# Patient Record
Sex: Female | Born: 1995 | Hispanic: Yes | Marital: Single | Smoking: Never smoker
Health system: Southern US, Community
[De-identification: ages and names within clinical notes are randomized; demographics above are authoritative.]

---
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
## 2017-05-17 ENCOUNTER — Ambulatory Visit (INDEPENDENT_AMBULATORY_CARE_PROVIDER_SITE_OTHER): Payer: No Typology Code available for payment source | Admitting: Sports Medicine

## 2017-05-17 VITALS — BP 110/70 | Ht 65.0 in | Wt 120.0 lb

## 2017-05-17 DIAGNOSIS — M722 Plantar fascial fibromatosis: Secondary | ICD-10-CM

## 2017-05-17 NOTE — Patient Instructions (Signed)
  Wear your custom orthotics in your running shoes  Try the scaphoid pads in your track spikes  Ice your heel for 10 minutes after every run  Do your calf stretches and plantar fascia stretches  You can try a Leafy Kindle[place]assburg Sock if you want. You can get it at [employer]  You're ok to run as long as the pain isn't severe and you're not limping while running

## 2017-05-18 ENCOUNTER — Encounter: Payer: Self-pay | Admitting: Sports Medicine

## 2017-05-18 NOTE — Progress Notes (Addendum)
   Subjective:    Patient ID: Donna Olsen, female    DOB: [birth year], 22 y.o.   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 Pain is most noticeable when walking, especially after running. Her pain does improve somewhat while running. She saw another physician a couple of weeks ago and was diagnosed with plantar fasciitis. She's been treated with iontophoresis in the training room. She was also instructed in some heel drop exercises and has been doing some static calf stretches. She denies radiating pain into the rest of the foot. She denies numbness or tingling. She has not noticed any swelling. No pain at rest. No swelling. She denies similar problems in the past. She is here today with her athletic trainer.  Past medical history reviewed Medications reviewed Allergies reviewed    Review of Systems As above    Objective:   Physical Exam  Well-developed, fit appearing. No acute distress. Awake alert and oriented 3. Vital signs reviewed  Left foot: There is tenderness to palpation at the calcaneal origin of the plantar fascia. No soft tissue swelling. Negative calcaneal squeeze. Negative Tinel's at the tarsal tunnel. Rigid cavus foot. Good pulses. Neurovascularly intact distally.  MSK ultrasound of the left heel was performed. Limited images were obtained. Plantar fascia measures 0.41 cm. No obvious spurring. There is an area of hypoechoic change deep within the plantar fascia likely consistent with inflammation.      Assessment & Plan:   Left heel pain secondary to plantar fasciitis  Patient's plantar fascia is slightly thickened on ultrasound. Clinical exam supports the diagnosis of plantar fasciitis as well. She has a rigid cavus foot that would benefit from custom orthotics.  Custom orthotics were created for her today. We also gave her some scaphoid pads to try in her track spikes. We educated her on proper calf stretching and plantar fascial stretching. She may also try a Strassburg sock at night if she would like. She will ice her heel after running. She may continue with iontophoresis in the training room. She may also continue to run using pain as her guide. Follow-up for ongoing or recalcitrant issues. Total time spent with the patient was 30 minutes with greater than 50% of the time spent in face-to-face consultation discussing diagnosis, treatment, and orthotic construction.  Patient was fitted for a : standard, cushioned, semi-rigid orthotic. The orthotic was heated and afterward the patient stood on the orthotic blank positioned on the orthotic stand. The patient was positioned in subtalar neutral position and 10 degrees of ankle dorsiflexion in a weight bearing stance. After completion of molding, a stable base was applied to the orthotic blank. The blank was ground to a stable position for weight bearing. Size: 8 Base: Blue EVA Posting: none Additional orthotic padding: none

## 2017-08-13 IMAGING — MR MR ANKLE*L* W/O CM
5 series · 40 of 40 positions shown · non-contrast
Comparison: None.

CLINICAL DATA: Lateral left ankle pain with running for more than
45 minutes. No known injury. Initial encounter.

EXAM:
MRI OF THE LEFT ANKLE WITHOUT CONTRAST
TECHNIQUE: Multiplanar, multisequence MR imaging of the ankle was performed. No
intravenous contrast was administered.

[Series 3: T2 fat-sat · axial · 3.0mm · 0.56mm/px · z∈[-44,+69]mm · 9 of 30 slices shown (1 of 3)]
[im 1/30]
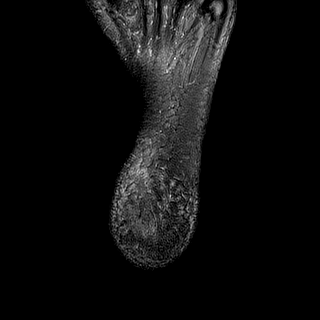
[im 4/30]
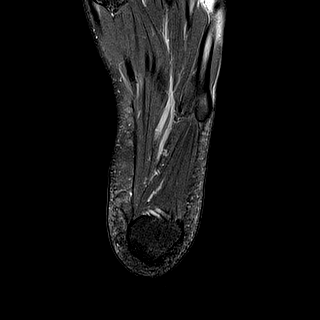
[im 8/30]
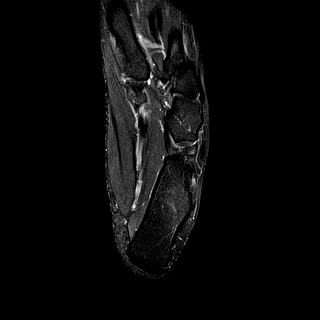
[im 11/30]
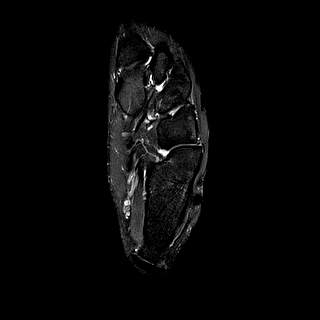
[im 15/30]
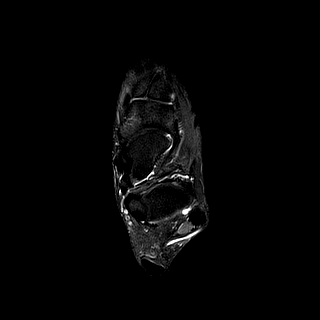
[im 19/30]
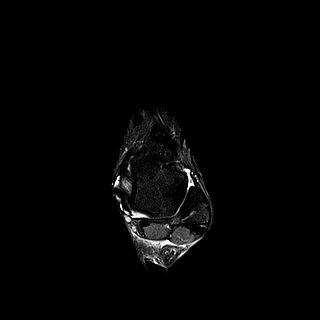
[im 22/30]
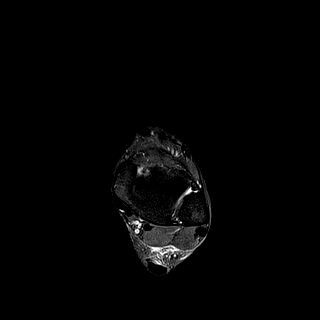
[im 26/30]
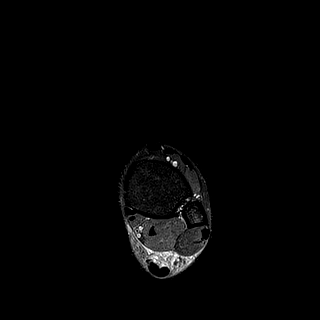
[im 30/30]
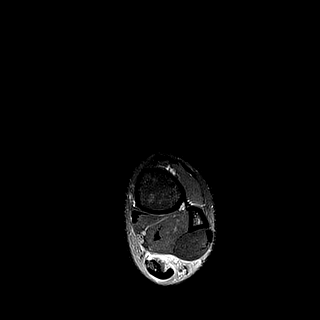

[Series 4: PD fat-sat · axial · 3.0mm · 0.56mm/px · z∈[-44,+69]mm · 9 of 30 slices shown]
[im 1/30]
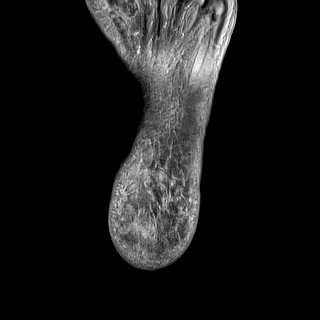
[im 4/30]
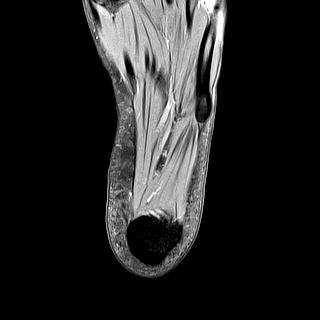
[im 8/30]
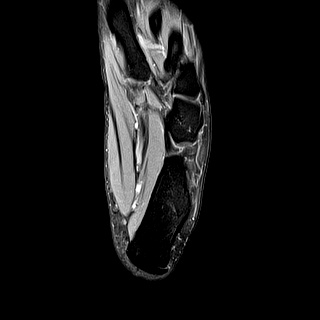
[im 11/30]
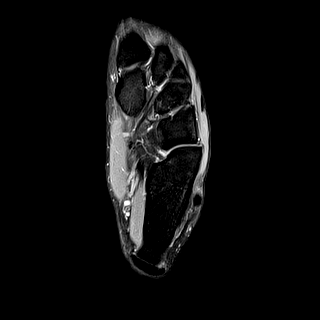
[im 15/30]
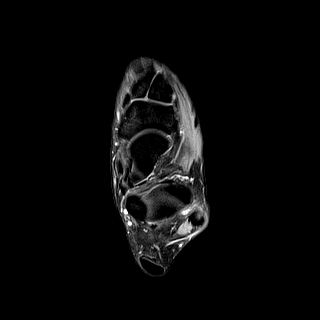
[im 19/30]
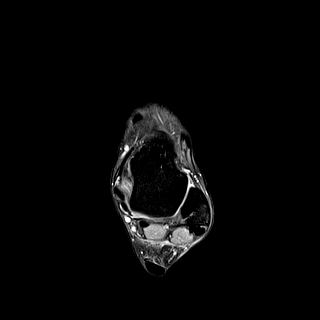
[im 22/30]
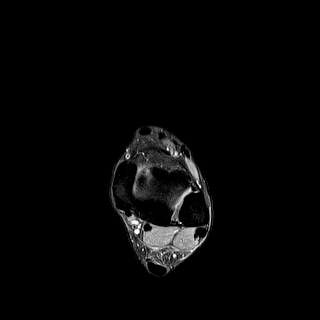
[im 26/30]
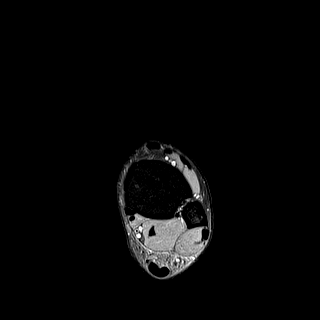
[im 30/30]
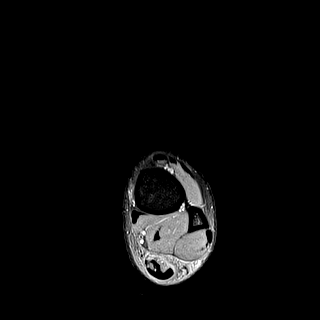

[Series 5: T1 · sagittal · 3.0mm · 0.56mm/px · 7 of 22 slices shown]
[im 1/22]
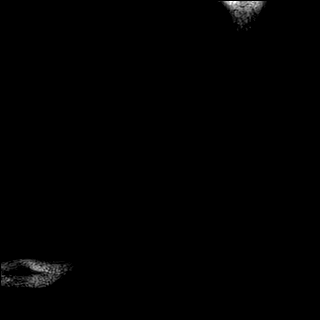
[im 4/22]
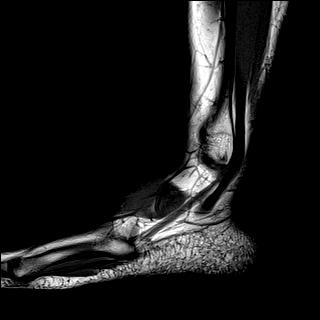
[im 8/22]
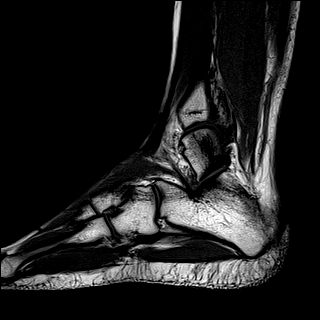
[im 11/22]
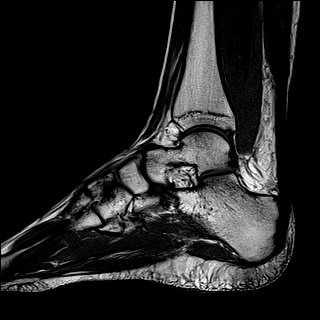
[im 15/22]
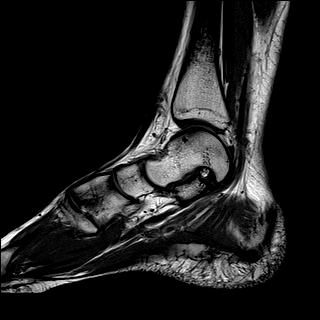
[im 18/22]
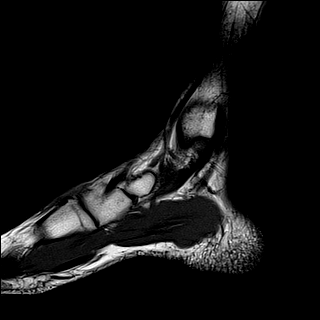
[im 22/22]
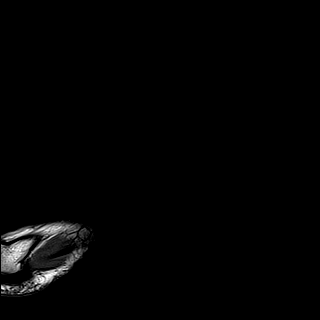

[Series 6: T2 fat-sat · sagittal · 3.0mm · 0.56mm/px · 7 of 22 slices shown (2 of 3)]
[im 1/22]
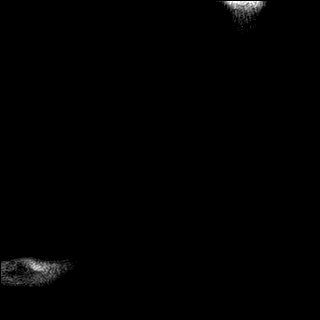
[im 4/22]
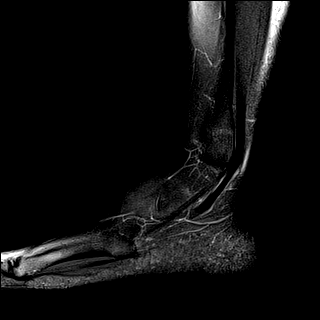
[im 8/22]
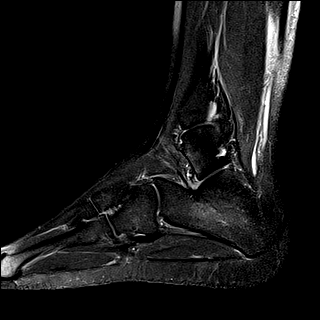
[im 11/22]
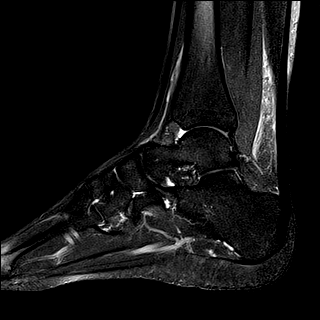
[im 15/22]
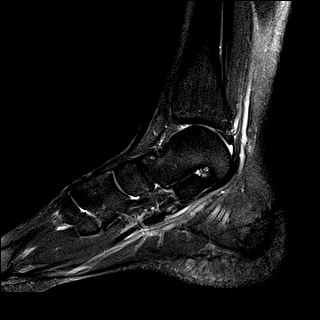
[im 18/22]
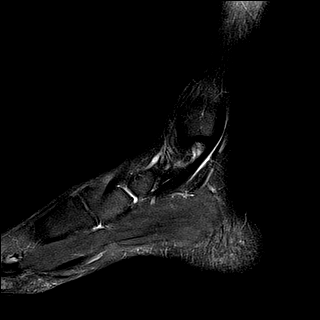
[im 22/22]
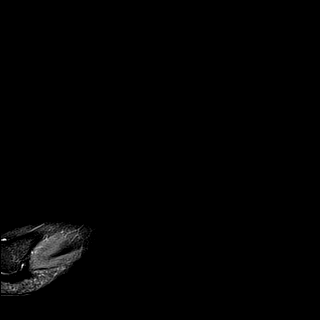

[Series 7: T2 fat-sat · coronal · 3.5mm · 0.56mm/px · 8 of 25 slices shown (3 of 3)]
[im 1/25]
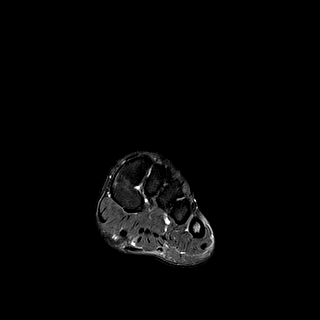
[im 4/25]
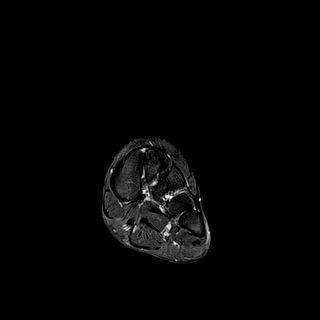
[im 7/25]
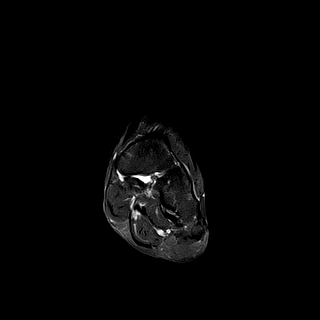
[im 11/25]
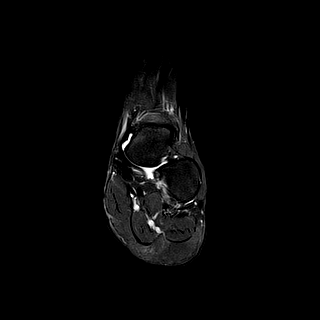
[im 14/25]
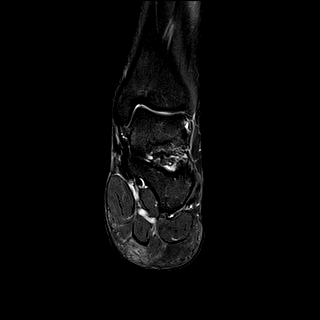
[im 18/25]
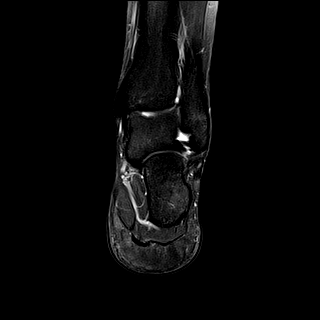
[im 21/25]
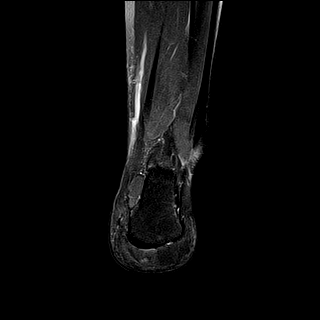
[im 25/25]
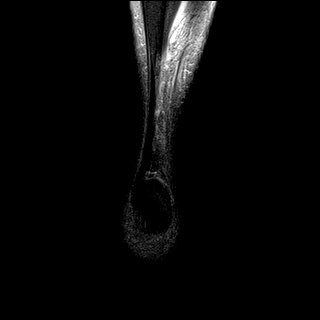

[40 of 40 positions shown; findings below may reference images not displayed]

FINDINGS: TENDONS

Peroneal: Intact.

Posteromedial: Intact.

Anterior: Intact.

Achilles: Intact.

Plantar Fascia: Unremarkable.

LIGAMENTS

Lateral: Intact.

Medial: Intact.

CARTILAGE

Ankle Joint: Unremarkable.

Subtalar Joints/Sinus Tarsi: Unremarkable.

Bones: Normal marrow signal throughout.

Other: None.
IMPRESSION: Negative MRI left ankle.

## 2017-10-10 ENCOUNTER — Ambulatory Visit (INDEPENDENT_AMBULATORY_CARE_PROVIDER_SITE_OTHER): Payer: No Typology Code available for payment source | Admitting: Sports Medicine

## 2017-10-10 VITALS — BP 90/60 | Ht 65.0 in | Wt 120.0 lb

## 2017-10-10 DIAGNOSIS — M7631 Iliotibial band syndrome, right leg: Secondary | ICD-10-CM

## 2017-10-10 NOTE — Progress Notes (Signed)
   Subjective:    Patient ID: Donna Olsen, female    DOB: 1995-06-06, 22 y.o.   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 No injury that she can recall but a gradual onset of pain that occurs at a specific point during her run. She describes a sharp stabbing sensation without any radiation.She's not noticed any swelling. No mechanical symptoms. She is still having a small amount of pain in her left heel from plantar fasciitis but that is resolving. She has custom orthotics which are comfortable. She is here today with her athletic trainer.  Interim medical history reviewed Medications reviewed Allergies reviewed    Review of Systems As above    Objective:   Physical Exam  Well-developed, fit appearing. No acute distress. Awake alert and oriented 3. Vital signs reviewed  Right knee: Full range of motion. No effusion. No patellofemoral crepitus. No real tenderness to palpation. Good ligamentous stability. Negative McMurray's.  Right hip: Smooth painless hip range of motion. 4+/5 strength with hip abduction compared to 5/5 on the right.      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 I've given her some IT band stretches and hip abductor strengthening exercises. She has been icing intermittently after running and I've encouraged her to continue this. I think she is okay to continue training using pain as her guide. She does understand that it may be several weeks before complete symptom resolution. Her orthotics are still in good shape. She will follow-up for ongoing or recalcitrant issues.

## 2018-03-03 ENCOUNTER — Encounter: Payer: Self-pay | Admitting: Sports Medicine

## 2018-03-03 ENCOUNTER — Ambulatory Visit (INDEPENDENT_AMBULATORY_CARE_PROVIDER_SITE_OTHER): Payer: No Typology Code available for payment source | Admitting: Sports Medicine

## 2018-03-03 VITALS — BP 90/68 | Ht 65.0 in | Wt 120.0 lb

## 2018-03-03 DIAGNOSIS — M79672 Pain in left foot: Secondary | ICD-10-CM

## 2018-03-03 MED ORDER — MELOXICAM 15 MG PO TABS: ORAL_TABLET | ORAL | 0 refills | Status: AC

## 2018-03-03 NOTE — Progress Notes (Signed)
  Donna Olsen - 23 y.o. 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  Patient began experiencing pain about 1 week ago during a stretch in which her foot was dorsiflexed and planted.  She initially had no pain with running but over the past several days she began develop pain.  She denies any localized swelling, erythema, or bruising.  Despite the pain she is continued to be able to run at least 6 miles without worsening pain.  She does feel that she has slowed down her pace as result however.  She has been using ice and ibuprofen with minimal benefit.  No numbness or tingling.  No associated skin changes.   ROS:     See HPI  PERTINENT  PMH / PSH FH / / SH:  Past Medical, Surgical, Social, and Family History Reviewed & Updated in the EMR.     OBJECTIVE: BP 90/68   Ht 5\' 5"  (1.651 m)   Wt 120 lb (54.4 kg)   BMI 19.97 kg/m   Physical Exam:  Vital signs are reviewed.  GEN: Alert and oriented, NAD Pulm: Breathing unlabored PSY: normal mood, congruent affect  MSK: Left foot: Inspection:  No obvious bony deformity.  No swelling, erythema, or bruising.  Slight transverse arch collapse with standing Palpation: No tenderness over any bony structures.  She does have slight tenderness between the first and second metatarsals around the middle third.  No palpable swelling or nodule. ROM: Full  ROM of the ankle. Normal midfoot flexibility Strength: 5/5 strength ankle in all planes without pain Neurovascular: N/V intact distally in the lower extremity  MSK [place]: Limited ultrasound of the left foot shows no acute bony abnormality or swelling over the first and second metatarsals.  There is no obvious swelling or soft tissue abnormality in the space between the first and second metatarsals.  Right foot: No obvious deformity.  No transverse arch collapse with  weightbearing -Full range of motion 5/5 strength N/V intact   ASSESSMENT & PLAN:  1.  Left foot pain- suspect slight ligamentous sprain.  Structurally her foot and ankle are normal.  No abnormal findings on ultrasound.  Her previous x-rays were independently reviewed today which show no acute bony abnormalities. -We will place her on meloxicam 15 mg daily for the next 10 days - Arch strap for additional support -She may continue to run so long as her pain is not worsening - I will follow-up with her in 2 weeks at the training room  Patient seen and evaluated with the sports medicine fellow.  I agree with the above plan of care.  Symptoms are consistent with a mild ligamentous sprain.  Treatment as above.  If symptoms persist or worsen then we may need to consider a brief period of rest versus possible further diagnostic imaging.  Follow-up with Dr. Jamse Mead in the training room in a week or so and follow-up formally in the office for ongoing or recalcitrant issues.

## 2018-03-03 NOTE — Patient Instructions (Signed)
You likely have a sprain of 1 of the ligaments in your foot. -We will start you on meloxicam 15 mg daily for the next 10 days - Wear the arch strap for additional support - A follow-up with you in about 2 weeks in the training room.

## 2018-07-13 ENCOUNTER — Emergency Department (HOSPITAL_COMMUNITY)
Admission: EM | Admit: 2018-07-13 | Discharge: 2018-07-14 | Disposition: A | Discharge status: Home / Self Care | Payer: No Typology Code available for payment source | Attending: Emergency Medicine | Admitting: Emergency Medicine

## 2018-07-13 ENCOUNTER — Ambulatory Visit (INDEPENDENT_AMBULATORY_CARE_PROVIDER_SITE_OTHER)
Admission: EM | Admit: 2018-07-13 | Discharge: 2018-07-13 | Disposition: A | Discharge status: Home / Self Care | Payer: No Typology Code available for payment source | Source: Home / Self Care | Attending: Internal Medicine | Admitting: Internal Medicine

## 2018-07-13 ENCOUNTER — Encounter (HOSPITAL_COMMUNITY): Payer: Self-pay

## 2018-07-13 ENCOUNTER — Other Ambulatory Visit: Payer: Self-pay

## 2018-07-13 DIAGNOSIS — K625 Hemorrhage of anus and rectum: Secondary | ICD-10-CM

## 2018-07-13 LAB — POCT URINALYSIS DIP (DEVICE)
Bilirubin Urine: NEGATIVE
Glucose, UA: NEGATIVE mg/dL
Hgb urine dipstick: NEGATIVE
Ketones, ur: NEGATIVE mg/dL
Leukocytes,Ua: NEGATIVE
Nitrite: NEGATIVE
Protein, ur: NEGATIVE mg/dL
Specific Gravity, Urine: 1.01 (ref 1.005–1.030)
Urobilinogen, UA: 0.2 mg/dL (ref 0.0–1.0)
pH: 7 (ref 5.0–8.0)

## 2018-07-13 LAB — CBC
HCT: 38.2 % (ref 36.0–46.0)
Hemoglobin: 12.7 g/dL (ref 12.0–15.0)
MCH: 31.4 pg (ref 26.0–34.0)
MCHC: 33.2 g/dL (ref 30.0–36.0)
MCV: 94.3 fL (ref 80.0–100.0)
Platelets: 160 10*3/uL (ref 150–400)
RBC: 4.05 MIL/uL (ref 3.87–5.11)
RDW: 12.2 % (ref 11.5–15.5)
WBC: 5.3 10*3/uL (ref 4.0–10.5)
nRBC: 0 % (ref 0.0–0.2)

## 2018-07-13 LAB — COMPREHENSIVE METABOLIC PANEL
ALT: 32 U/L (ref 0–44)
AST: 41 U/L (ref 15–41)
Albumin: 4.1 g/dL (ref 3.5–5.0)
Alkaline Phosphatase: 56 U/L (ref 38–126)
Anion gap: 10 (ref 5–15)
BUN: 14 mg/dL (ref 6–20)
CO2: 27 mmol/L (ref 22–32)
Calcium: 9.6 mg/dL (ref 8.9–10.3)
Chloride: 103 mmol/L (ref 98–111)
Creatinine, Ser: 0.75 mg/dL (ref 0.44–1.00)
GFR calc Af Amer: >60 mL/min (ref >60)
GFR calc non Af Amer: >60 mL/min (ref >60)
Glucose, Bld: 101 mg/dL — ABNORMAL HIGH (ref 70–99)
Potassium: 3.4 mmol/L — ABNORMAL LOW (ref 3.5–5.1)
Sodium: 140 mmol/L (ref 135–145)
Total Bilirubin: 1.6 mg/dL — ABNORMAL HIGH (ref 0.3–1.2)
Total Protein: 6.7 g/dL (ref 6.5–8.1)

## 2018-07-13 LAB — HCG, QUANTITATIVE, PREGNANCY: hCG, Beta Chain, Quant, S: <1 m[IU]/mL (ref <5)

## 2018-07-13 LAB — POCT PREGNANCY, URINE: Preg Test, Ur: NEGATIVE

## 2018-07-13 NOTE — ED Provider Notes (Signed)
[facility]    CSN: [id number] Arrival date & time: 07/13/18  1419     History   Chief Complaint Chief Complaint  Patient presents with  . Hematuria    HPI Donna Olsen is a 23 y.o. female.   Donna Olsen is a 23 year old female that presents with rectal bleeding and diarrhea.  This has been present over the past few days. Mild pain in rectum. Suffers from constipation from time to time. Some straining with BM.  Also suffers from diarrhea at times. Reports she runs about 50 miles a week and usually eats healthy diet. She is sexually active with one partner, treated for chlamydia last year. Denies any vaginal discharge, itching or irritation. No dysuria, urinary frequency. Finished her menstrual cycle 3 days ago. No fever, chill, recent travels. No recent abx use.  No abdominal pain, back pain, pelvic pain.  No dizziness, lightheadedness, chest pain or shortness of breath.  No history of anemia.  ROS per HPI      History reviewed. No pertinent past medical history.  There are no active problems to display for this patient.   History reviewed. No pertinent surgical history.  OB History   No obstetric history on file.      Home Medications    Prior to Admission medications   Medication Sig Start Date End Date Taking? Authorizing Provider  meloxicam (MOBIC) 15 MG tablet Take 1 tablet daily with food for 10 days. Then take as needed. 03/03/18   Ralene Corkraper, Timothy R, DO    Family History Family History  Problem Relation Age of Onset  . Healthy Mother   . Healthy Father     Social History Social History   Tobacco Use  . Smoking status: Never Smoker  . Smokeless tobacco: Never Used  Substance Use Topics  . Alcohol use: Never    Frequency: Never  . Drug use: Never     Allergies   Patient has no known allergies.   Review of Systems Review of Systems   Physical Exam Triage Vital Signs ED Triage Vitals  Enc Vitals Group     BP 07/13/18 1430 107/74   Pulse Rate 07/13/18 1430 74     Resp 07/13/18 1430 16     Temp 07/13/18 1430 98 F (36.7 C)     Temp src --      SpO2 07/13/18 1430 98 %     Weight 07/13/18 1428 116 lb (52.6 kg)     Height --      Head Circumference --      Peak Flow --      Pain Score 07/13/18 1428 0     Pain Loc --      Pain Edu? --      Excl. in GC? --    No data found.  Updated Vital Signs BP 107/74 (BP Location: Right Arm)   Pulse 74   Temp 98 F (36.7 C)   Resp 16   Wt 116 lb (52.6 kg)   LMP 07/04/2018   SpO2 98%   BMI 19.30 kg/m   Visual Acuity Right Eye Distance:   Left Eye Distance:   Bilateral Distance:    Right Eye Near:   Left Eye Near:    Bilateral Near:     Physical Exam Vitals signs and nursing note reviewed.  Constitutional:      Appearance: Normal appearance.  HENT:     Head: Normocephalic and atraumatic.     Nose:  Nose normal.  Eyes:     Conjunctiva/sclera: Conjunctivae normal.  Pulmonary:     Effort: Pulmonary effort is normal.  Genitourinary:    Rectum: Normal.     Comments: No external hemorrhoids or fissure No pain with internal rectal exam.  Some stool felt on exam with small amount of blood and stool on glove.  No pain Skin:    General: Skin is warm and dry.  Neurological:     Mental Status: She is alert.  Psychiatric:        Mood and Affect: Mood normal.      UC Treatments / Results  Labs (all labs ordered are listed, but only abnormal results are displayed) Labs Reviewed  POC URINE PREG, ED  POCT URINALYSIS DIP (DEVICE)  POCT PREGNANCY, URINE    EKG None  Radiology No results found.  Procedures Procedures (including critical care time)  Medications Ordered in UC Medications - No data to display  Initial Impression / Assessment and Plan / UC Course  I have reviewed the triage vital signs and the nursing notes.  Pertinent labs & imaging results that were available during my care of the patient were reviewed by me and considered in my  medical decision making (see chart for details).    Rectal bleeding with diarrhea.   No concerning signs or symptoms on exam. Most likely bleeding is from intestinal irritation due to the diarrhea or internal hemorrhoid or fissure. I do not think this is infectious diarrhea.  Instructed to monitor her symptoms and if they become more severe with heavier bleeding, dizziness, lightheadedness or shortness of breath she will need to go to the ER. Donna Olsen understanding and agreed.  Final Clinical Impressions(s) / UC Diagnoses   Final diagnoses:  Rectal bleeding     Discharge Instructions     I am not seeing anything concerning on exam No sign of external hemorrhoids. There is no sign of external trauma There could be an internal hemorrhoid or fissure that is causing her bleeding. Or it could just be irritation from the diarrhea I would just keep monitoring your symptoms. If they worsen to include more severe bleeding, abdominal pain, dizziness or shortness of breath and you will need to be re-seen.     ED Prescriptions    None     Controlled Substance Prescriptions  Controlled Substance Registry consulted? Not Applicable   Janace Aris, NP 07/13/18 1545

## 2018-07-13 NOTE — ED Notes (Signed)
Pt. Ambulated with steady gait to triage room

## 2018-07-13 NOTE — ED Triage Notes (Signed)
Pt states she has noted blood in her stool that started this morning. She reports some lower abdominal pain and some diarrhea. She states stool is dark as well as some BRB in the stool.

## 2018-07-13 NOTE — ED Triage Notes (Signed)
Pt states she has blood in her urine and stool today.

## 2018-07-13 NOTE — Discharge Instructions (Addendum)
I am not seeing anything concerning on exam No sign of external hemorrhoids. There is no sign of external trauma There could be an internal hemorrhoid or fissure that is causing her bleeding. Or it could just be irritation from the diarrhea I would just keep monitoring your symptoms. If they worsen to include more severe bleeding, abdominal pain, dizziness or shortness of breath and you will need to be re-seen.

## 2018-07-14 NOTE — ED Provider Notes (Signed)
[facility] EMERGENCY DEPARTMENT Provider Note   CSN: [id number] Arrival date & time: 07/13/18  2237    History   Chief Complaint Chief Complaint  Patient presents with  . Rectal Bleeding    HPI Donna Olsen is a 23 y.o. female.     The history is provided by the patient.  Rectal Bleeding  Quality:  Bright red Amount:  Moderate Timing:  Intermittent Chronicity:  New Relieved by:  None tried Worsened by:  Nothing Associated symptoms: no abdominal pain, no dizziness, no fever, no hematemesis, no light-headedness and no vomiting   Risk factors: no anticoagulant use, no liver disease and no NSAID use    PT Presents for evaluation of rectal bleeding.  She reports she noted blood in her stool earlier in the day. She reports she was seen in urgent care for this, but after going home her mother encouraged her to go back to the ER for lab testing Since urgent care evaluation, she has had no further rectal bleeding.  No vomiting She is feeling improved.  No lightheadedness   PMH-none No NSAID abuse OB History   No obstetric history on file.      Home Medications    Prior to Admission medications   Medication Sig Start Date End Date Taking? Authorizing Provider  meloxicam (MOBIC) 15 MG tablet Take 1 tablet daily with food for 10 days. Then take as needed. 03/03/18   Ralene Cork, DO    Family History Family History  Problem Relation Age of Onset  . Healthy Mother   . Healthy Father     Social History Social History   Tobacco Use  . Smoking status: Never Smoker  . Smokeless tobacco: Never Used  Substance Use Topics  . Alcohol use: Never    Frequency: Never  . Drug use: Never     Allergies   Patient has no known allergies.   Review of Systems Review of Systems  Constitutional: Negative for fever.  Gastrointestinal: Positive for hematochezia. Negative for abdominal pain, hematemesis and vomiting.  Genitourinary:       Reported  hematuria, improved   Neurological: Negative for dizziness and light-headedness.  All other systems reviewed and are negative.    Physical Exam Updated Vital Signs BP 107/69   Pulse 64   Temp 98.1 F (36.7 C) (Oral)   Resp (!) 21   LMP 07/04/2018   SpO2 97%   Physical Exam CONSTITUTIONAL: Well developed/well nourished HEAD: Normocephalic/atraumatic EYES: EOMI/PERRL, conjunctiva pink ENMT: Mucous membranes moist NECK: supple no meningeal signs SPINE/BACK:entire spine nontender CV: S1/S2 noted, no murmurs/rubs/gallops noted LUNGS: Lungs are clear to auscultation bilaterally, no apparent distress ABDOMEN: soft, nontender Rectal - deferred as she just had one at urgent care NEURO: Pt is awake/alert/appropriate, moves all extremitiesx4.  No facial droop.   EXTREMITIES: pulses normal/equal, full ROM SKIN: warm, color normal PSYCH: no abnormalities of mood noted, alert and oriented to situation   ED Treatments / Results  Labs (all labs ordered are listed, but only abnormal results are displayed) Labs Reviewed  COMPREHENSIVE METABOLIC PANEL - Abnormal; Notable for the following components:      Result Value   Potassium 3.4 (*)    Glucose, Bld 101 (*)    Total Bilirubin 1.6 (*)    All other components within normal limits  CBC  HCG, QUANTITATIVE, PREGNANCY  I-STAT BETA HCG BLOOD, ED ([facility], [facility], [facility] ONLY)  POC OCCULT BLOOD, ED    EKG None  Radiology No results found.  Procedures Procedures    Medications Ordered in ED Medications - No data to display   Initial Impression / Assessment and Plan / ED Course  I have reviewed the triage vital signs and the nursing notes.  Pertinent labs  results that were available during my care of the patient were reviewed by me and considered in my medical decision making (see chart for details).        Seen earlier in the day by urgent care for rectal bleeding. She had small amount of blood per rectal exam at that time.  She  has had no further rectal bleeding since that time.  Labs reassuring.  Patient feels well.  Will refer to GI.  Stable for discharge  Final Clinical Impressions(s) / ED Diagnoses   Final diagnoses:  Rectal bleeding    ED Discharge Orders    None       Zadie RhineWickline, Rekisha Welling, MD 07/14/18 [id number] [phone]

## 2018-07-14 NOTE — ED Notes (Signed)
Discharge instructions review with pt. Pt verbalized care. No questions at this time.   Signature pad not available

## 2018-10-03 ENCOUNTER — Ambulatory Visit: Payer: No Typology Code available for payment source | Admitting: Sports Medicine

## 2018-10-10 ENCOUNTER — Ambulatory Visit: Payer: No Typology Code available for payment source | Admitting: Sports Medicine

## 2018-10-12 ENCOUNTER — Encounter: Payer: Self-pay | Admitting: Sports Medicine

## 2018-10-12 ENCOUNTER — Ambulatory Visit (INDEPENDENT_AMBULATORY_CARE_PROVIDER_SITE_OTHER): Payer: No Typology Code available for payment source | Admitting: Sports Medicine

## 2018-10-12 ENCOUNTER — Other Ambulatory Visit: Payer: Self-pay

## 2018-10-12 VITALS — BP 100/70 | Ht 64.0 in | Wt 119.0 lb

## 2018-10-12 DIAGNOSIS — M722 Plantar fascial fibromatosis: Secondary | ICD-10-CM | POA: Diagnosis not present

## 2018-10-12 NOTE — Progress Notes (Signed)
Patient is here for new orthotics. 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 Has a history of plantar fascitis of left foot and ligamentous sprain of left foot with pes cavus on exam. Please see prior note from 03/03/2018.  Patient was fitted for a : standard, cushioned, semi-rigid orthotic. The orthotic was heated and afterward the patient stood on the orthotic blank positioned on the orthotic stand. The patient was positioned in subtalar neutral position and 10 degrees of ankle dorsiflexion in a weight bearing stance. After completion of molding, a stable base was applied to the orthotic blank. The blank was ground to a stable position for weight bearing. Size: 9 Base: blue EVA Posting: none Additional orthotic padding: none  Gait was neutral with orthotics in place. Patient found them to be comfortable. Follow-up as needed.  Patient seen and evaluated with the sports medicine fellow.  I agree with the above plan of care.  Custom orthotics were made for this patient today.  She found them to be comfortable prior to leaving the office.  Follow-up as needed.

## 2019-03-23 ENCOUNTER — Ambulatory Visit
Admission: RE | Admit: 2019-03-23 | Discharge: 2019-03-23 | Disposition: A | Discharge status: Home / Self Care | Payer: No Typology Code available for payment source | Source: Ambulatory Visit | Attending: Family Medicine | Admitting: Family Medicine

## 2019-03-23 ENCOUNTER — Other Ambulatory Visit: Payer: Self-pay

## 2019-03-23 ENCOUNTER — Ambulatory Visit (INDEPENDENT_AMBULATORY_CARE_PROVIDER_SITE_OTHER): Payer: No Typology Code available for payment source | Admitting: Sports Medicine

## 2019-03-23 ENCOUNTER — Encounter: Payer: Self-pay | Admitting: Sports Medicine

## 2019-03-23 VITALS — BP 102/70 | Ht 65.0 in | Wt 120.0 lb

## 2019-03-23 DIAGNOSIS — M79672 Pain in left foot: Secondary | ICD-10-CM

## 2019-03-23 NOTE — Patient Instructions (Signed)
Your foot pain may be caused by stress fracture to a bony foot called the navicular.  This bone does not get great blood supply so it is important to know whether you do have a stress injury to this bone -X-rays and MRI of your foot were ordered to better evaluate for possible stress injury -Wear the cam walker that you have at home while walking.  If you continue to have pain even while wearing the boot you need to use crutches and become nonweightbearing  We will see you back after the MRI

## 2019-03-23 NOTE — Progress Notes (Addendum)
PCP: Patient, No Pcp Per  Subjective:   HPI: Patient is a 24 y.o. female here for evaluation of left foot pain.  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  Several weeks ago she developed medial foot pain over her navicular while running.  The pain is gotten gradually worse over the last several weeks.  She is now getting pain with walking.  She is unable to run without getting significant pain.  The pain does not radiate.  She denies any bruising or swelling in the area.  She denies any numbness or tingling.  Patient's been wearing a cam walker intermittently due to the pain.   Review of Systems: See HPI above.  History reviewed. No pertinent past medical history.  Current Outpatient Medications on File Prior to Visit  Medication Sig Dispense Refill  . meloxicam (MOBIC) 15 MG tablet Take 1 tablet daily with food for 10 days. Then take as needed. 40 tablet 0   No current facility-administered medications on file prior to visit.    History reviewed. No pertinent surgical history.  No Known Allergies  Social History   Socioeconomic History  . Marital status: Single    Spouse name: Not on file  . Number of children: Not on file  . Years of education: Not on file  . Highest education level: Not on file  Occupational History  . Not on file  Tobacco Use  . Smoking status: Never Smoker  . Smokeless tobacco: Never Used  Substance and Sexual Activity  . Alcohol use: Never  . Drug use: Never  . Sexual activity: Yes  Other Topics Concern  . Not on file  Social History Narrative  . Not on file   Social Determinants of Health   Financial Resource Strain:   . Difficulty of Paying Living Expenses: Not on file  Food Insecurity:   . Worried About Charity fundraiser in the Last Year: Not on file  . Ran Out of Food in the Last Year: Not on file  Transportation Needs:   . Lack of Transportation (Medical): Not on file  . Lack of Transportation (Non-Medical): Not on file  Physical  Activity:   . Days of Exercise per Week: Not on file  . Minutes of Exercise per Session: Not on file  Stress:   . Feeling of Stress : Not on file  Social Connections:   . Frequency of Communication with Friends and Family: Not on file  . Frequency of Social Gatherings with Friends and Family: Not on file  . Attends Religious Services: Not on file  . Active Member of Clubs or Organizations: Not on file  . Attends Archivist Meetings: Not on file  . Marital Status: Not on file  Intimate Partner Violence:   . Fear of Current or Ex-Partner: Not on file  . Emotionally Abused: Not on file  . Physically Abused: Not on file  . Sexually Abused: Not on file    Family History  Problem Relation Age of Onset  . Healthy Mother   . Healthy Father         Objective:  Physical Exam: BP 102/70   Ht 5\' 5"  (1.651 m)   Wt 120 lb (54.4 kg)   BMI 19.97 kg/m  Gen: NAD, comfortable in exam room Lungs: Breathing comfortably on room air Ankle/Foot Exam Left  -Inspection: No deformity, no discoloration. Normal longitudinal and transverse arch -Palpation: Tenderness to palpation over the navicular -ROM: Normal ROM with dorsiflexion, plantarflexion, inversion,  eversion -Strength: Dorsiflexion: 5/5; Plantarflexion: 5/5; Inversion: 5/5; Eversion: 5/5 -Special Tests: Anterior drawer: negative; Talar tilt: Negative; Calcaneal squeeze: Negative; Tib/fib: Negative -Limb neurovascularly intact, no instability noted  -Gait: Normal   Limited diagnostic ultrasound of the left foot Findings: -Normal appearance of the tibialis anterior tendon -Normal appearance of the tibialis posterior tendon -Normal appearance of the navicular Impression: -Normal ultrasound findings   Assessment & Plan:  Patient is a 24 y.o. female here for evaluation of left foot pain  1.  Left foot pain, concern for navicular stress fracture -Ultrasound findings negative for stress fracture however the pain is only  been present for the last several weeks and her history and exam is concerning for a stress fracture -Given the concern for stress fracture of the navicular x-rays and MRI were ordered of her left foot -Patient was advised to continue wearing the cam walker while walking.  She is not cleared for running.  She was also given a set of crutches to wear if weightbearing even with a cam walker becomes painful  Patient will follow up after the MRI  I was the preceptor for this visit and available for immediate consultation Marsa Aris, DO

## 2019-04-02 ENCOUNTER — Ambulatory Visit (INDEPENDENT_AMBULATORY_CARE_PROVIDER_SITE_OTHER): Payer: No Typology Code available for payment source | Admitting: Family Medicine

## 2019-04-02 ENCOUNTER — Encounter: Payer: Self-pay | Admitting: Family Medicine

## 2019-04-02 ENCOUNTER — Other Ambulatory Visit: Payer: Self-pay

## 2019-04-02 VITALS — BP 100/60 | Ht 65.0 in | Wt 120.0 lb

## 2019-04-02 DIAGNOSIS — M79672 Pain in left foot: Secondary | ICD-10-CM

## 2019-04-02 NOTE — Patient Instructions (Signed)
Break these inserts in over the next week -wear in your walking shoes. Avoid barefoot walking, flat shoes as much as possible. Do the home exercises daily as directed. You can also get treatment in the training room at school in addition to these. In 1 week start the following: 1. Monday - 1 or 2 minute jog : 1 minute walk alternating for 10-15 minutes 2. Tuesday - 2-2.5 mile jog ideally on a flat surface 3. Wednesday - 50-75% of your typical track workout 4. Thursday - return to normal workouts.  This is a fairly aggressive protocol but as long as pain stays at 1-2/10 at most ok to progress through days as directed. Follow up with Donna Olsen in about 1 month but message [place] sooner or call if you have any questions or problems.

## 2019-04-02 NOTE — Progress Notes (Signed)
Patient came in today after reviewing her MRI results - no evidence navicular stress fracture.  Incidental finding of edema in plantar fascia, intermetatarsal bursitis but she does not have these clinically.  Overall doing better with the boot.  Sports insoles she had made about 7 months ago not comfortable to her - feels large stepoff from mid to forefoot.  Attempted to grind base down in midfoot but reported still uncomfortable so new orthotics made today.  Reviewed ant tib rehab as well and return to running protocol noted in instructions.    Patient was fitted for a : standard, cushioned, semi-rigid orthotic. The orthotic was heated and afterward the patient stood on the orthotic blank positioned on the orthotic stand. The patient was positioned in subtalar neutral position and 10 degrees of ankle dorsiflexion in a weight bearing stance. After completion of molding, a stable base was applied to the orthotic blank. The blank was ground to a stable position for weight bearing. Size: 8 red cambray Base: blue med density eva Posting: none Additional orthotic padding: none Patient felt more comfortable with these ambulating in hallway without pain.

## 2019-04-03 ENCOUNTER — Encounter: Payer: Self-pay | Admitting: Family Medicine

## 2019-04-10 ENCOUNTER — Other Ambulatory Visit: Payer: No Typology Code available for payment source
